# Patient Record
Sex: Female | Born: 1995 | Race: White | Hispanic: No | Marital: Single | Smoking: Never smoker
Health system: Southern US, Community
[De-identification: ages and names within clinical notes are randomized; demographics above are authoritative.]

## PROBLEM LIST (undated history)

## (undated) DIAGNOSIS — R569 Unspecified convulsions: Secondary | ICD-10-CM

---

## 2016-06-19 ENCOUNTER — Encounter (HOSPITAL_COMMUNITY): Payer: Self-pay | Admitting: Family Medicine

## 2016-06-19 ENCOUNTER — Emergency Department (HOSPITAL_COMMUNITY): Payer: BLUE CROSS/BLUE SHIELD

## 2016-06-19 ENCOUNTER — Emergency Department (HOSPITAL_COMMUNITY)
Admission: EM | Admit: 2016-06-19 | Discharge: 2016-06-19 | Disposition: A | Discharge status: Home / Self Care | Payer: BLUE CROSS/BLUE SHIELD | Attending: Emergency Medicine | Admitting: Emergency Medicine

## 2016-06-19 DIAGNOSIS — R0789 Other chest pain: Secondary | ICD-10-CM | POA: Insufficient documentation

## 2016-06-19 HISTORY — DX: Unspecified convulsions: R56.9

## 2016-06-19 LAB — CBC
HCT: 36.3 % (ref 36.0–46.0)
HEMOGLOBIN: 11.7 g/dL — AB (ref 12.0–15.0)
MCH: 25.5 pg — AB (ref 26.0–34.0)
MCHC: 32.2 g/dL (ref 30.0–36.0)
MCV: 79.1 fL (ref 78.0–100.0)
Platelets: 297 10*3/uL (ref 150–400)
RBC: 4.59 MIL/uL (ref 3.87–5.11)
RDW: 16.2 % — ABNORMAL HIGH (ref 11.5–15.5)
WBC: 6.5 10*3/uL (ref 4.0–10.5)

## 2016-06-19 LAB — BASIC METABOLIC PANEL
ANION GAP: 6 (ref 5–15)
BUN: 10 mg/dL (ref 6–20)
CHLORIDE: 106 mmol/L (ref 101–111)
CO2: 24 mmol/L (ref 22–32)
Calcium: 8.7 mg/dL — ABNORMAL LOW (ref 8.9–10.3)
Creatinine, Ser: 0.95 mg/dL (ref 0.44–1.00)
GFR calc non Af Amer: >60 mL/min (ref >60)
GLUCOSE: 101 mg/dL — AB (ref 65–99)
Potassium: 3.7 mmol/L (ref 3.5–5.1)
Sodium: 136 mmol/L (ref 135–145)

## 2016-06-19 LAB — I-STAT TROPONIN, ED: Troponin i, poc: 0 ng/mL (ref 0.00–0.08)

## 2016-06-19 NOTE — ED Notes (Signed)
Pt ambulated with discharge papers in hand with friends to check out. Alert and oriented in no distress upon discharge.

## 2016-06-19 NOTE — ED Provider Notes (Signed)
[facility]-EMERGENCY DEPT Provider Note   CSN: [id number] Arrival date & time: 06/19/16  1503     History   Chief Complaint Chief Complaint  Patient presents with  . Chest Pain    HPI Michelle Dillon is a 20 y.o. female. Complains of right-sided anterior chest pain gradual onset 3 PM today while seated. Pain is at right lower anterior chest, nonradiating. Nothing makes symptoms better or worse. Associated symptoms include mild lightheadedness shortness of breath and nausea. Symptoms have resolved except for mild twinges of pain lasting a split-second. No treatment prior to coming here. No other associated symptoms HPI  Past Medical History:  Diagnosis Date  . Seizures (HCC)   epilepsy. Last seizure several years ago. Anxiety attack There are no active problems to display for this patient.   History reviewed. No pertinent surgical history.  OB History    No data available       Home Medications    Prior to Admission medications   Not on File    Family History History reviewed. No pertinent family history.  Social History Social History  Substance Use Topics  . Smoking status: Never Smoker  . Smokeless tobacco: Never Used  . Alcohol use Yes     Comment: Once every other week.    Occasional alcohol. Uses marijuana last time one month ago. No other drug use  Allergies   Patient has no known allergies.   Review of Systems Review of Systems  Constitutional: Negative.   HENT: Negative.   Respiratory: Negative.   Cardiovascular: Negative.   Gastrointestinal: Negative.   Musculoskeletal: Negative.   Skin: Negative.   Neurological: Negative.   Psychiatric/Behavioral: Negative.   All other systems reviewed and are negative.    Physical Exam Updated Vital Signs BP 129/94 (BP Location: Right Arm)   Pulse 78   Temp 97.7 F (36.5 C) (Oral)   Resp 18   Ht 5\' 7"  (1.702 m)   Wt 114 lb (51.7 kg)   LMP 05/29/2016 (Within Days) Comment: 3 weeks ago  SpO2 98%    BMI 17.85 kg/m   Physical Exam  Constitutional: She appears well-developed and well-nourished.  HENT:  Head: Normocephalic and atraumatic.  Eyes: Conjunctivae are normal. Pupils are equal, round, and reactive to light.  Neck: Neck supple. No tracheal deviation present. No thyromegaly present.  Cardiovascular: Normal rate and regular rhythm.   No murmur heard. Pulmonary/Chest: Effort normal and breath sounds normal.  Abdominal: Soft. Bowel sounds are normal. She exhibits no distension. There is no tenderness.  Musculoskeletal: Normal range of motion. She exhibits no edema or tenderness.  Neurological: She is alert. Coordination normal.  Skin: Skin is warm and dry. No rash noted.  Psychiatric: She has a normal mood and affect.  Nursing note and vitals reviewed.    ED Treatments / Results  Labs (all labs ordered are listed, but only abnormal results are displayed) Labs Reviewed  CBC - Abnormal; Notable for the following:       Result Value   Hemoglobin 11.7 (*)    MCH 25.5 (*)    RDW 16.2 (*)    All other components within normal limits  BASIC METABOLIC PANEL  I-STAT TROPOININ, ED    EKG  EKG Interpretation  Date/Time:  Wednesday June 19 2016 15:30:28 EST Ventricular Rate:  81 PR Interval:    QRS Duration: 96 QT Interval:  372 QTC Calculation: 432 R Axis:   88 Text Interpretation:  Sinus rhythm Normal ECG Confirmed by  Mishti Swanton  MD, Doreatha MartinSAM [phone]) on 06/19/2016 4:09:41 PM Also confirmed by Ethelda ChickJACUBOWITZ  MD, Orlando Thalmann [phone]([id number]), editor Wandalee FerdinandLOGAN, KIMBERLY [phone])  on 06/19/2016 4:18:00 PM     chest x-ray viewed by me Results for orders placed or performed during the hospital encounter of 06/19/16  Basic metabolic panel  Result Value Ref Range   Sodium 136 135 - 145 mmol/L   Potassium 3.7 3.5 - 5.1 mmol/L   Chloride 106 101 - 111 mmol/L   CO2 24 22 - 32 mmol/L   Glucose, Bld 101 (H) 65 - 99 mg/dL   BUN 10 6 - 20 mg/dL   Creatinine, Ser 6.570.95 0.44 - 1.00 mg/dL   Calcium 8.7 (L)  8.9 - 10.3 mg/dL   GFR calc non Af Amer >60 >60 mL/min   GFR calc Af Amer >60 >60 mL/min   Anion gap 6 5 - 15  CBC  Result Value Ref Range   WBC 6.5 4.0 - 10.5 K/uL   RBC 4.59 3.87 - 5.11 MIL/uL   Hemoglobin 11.7 (L) 12.0 - 15.0 g/dL   HCT 84.636.3 96.236.0 - 95.246.0 %   MCV 79.1 78.0 - 100.0 fL   MCH 25.5 (L) 26.0 - 34.0 pg   MCHC 32.2 30.0 - 36.0 g/dL   RDW 84.116.2 (H) 32.411.5 - 40.115.5 %   Platelets 297 150 - 400 K/uL  I-stat troponin, ED  Result Value Ref Range   Troponin i, poc 0.00 0.00 - 0.08 ng/mL   Comment 3           Dg Chest 2 View  Result Date: 06/19/2016 CLINICAL DATA:  Right-sided chest pain EXAM: CHEST  2 VIEW COMPARISON:  None. FINDINGS: Normal heart size. Lungs are hyperaerated and clear. No pneumothorax. No pleural effusion. IMPRESSION: No active cardiopulmonary disease. Electronically Signed   By: Jolaine ClickArthur  Hoss M.D.   On: 06/19/2016 16:04   Radiology Dg Chest 2 View  Result Date: 06/19/2016 CLINICAL DATA:  Right-sided chest pain EXAM: CHEST  2 VIEW COMPARISON:  None. FINDINGS: Normal heart size. Lungs are hyperaerated and clear. No pneumothorax. No pleural effusion. IMPRESSION: No active cardiopulmonary disease. Electronically Signed   By: Jolaine ClickArthur  Hoss M.D.   On: 06/19/2016 16:04    Procedures Procedures (including critical care time)  Medications Ordered in ED Medications - No data to display   Initial Impression / Assessment and Plan / ED Course  I have reviewed the triage vital signs and the nursing notes.  Pertinent labs & imaging results that were available during my care of the patient were reviewed by me and considered in my medical decision making (see chart for details). Clinical Course     4:45 PM patient asymptomatic appears well. Chest pain highly atypical. Cardiac risk factors none Heart score was 0. Symptoms atypical for pulmonary embolism. PERC negative. Symptoms highly atypical for aortic dissection or aneurysm. Similar blood pressures in both arms.  Peripheral pulses symmetric bilaterally plan return as needed or follow-up [facility] Final Clinical Impressions(s) / ED Diagnoses  Diagnosis atypical chest pain Final diagnoses:  None    New Prescriptions New Prescriptions   No medications on file     Doug SouSam Dwight Burdo, MD 06/19/16 1650

## 2016-06-19 NOTE — ED Triage Notes (Signed)
Patient complains of right sided chest pain that started about 45 minutes ago while watching a movie. Pt reports pain is sharp with shortness of breath, dizziness, nausea, and generalized weakness.

## 2016-06-19 NOTE — Discharge Instructions (Signed)
Return if concern for any reason or see the [facility]

## 2017-06-07 ENCOUNTER — Emergency Department (HOSPITAL_COMMUNITY): Payer: BLUE CROSS/BLUE SHIELD

## 2017-06-07 ENCOUNTER — Emergency Department (HOSPITAL_COMMUNITY)
Admission: EM | Admit: 2017-06-07 | Discharge: 2017-06-07 | Disposition: A | Discharge status: Home / Self Care | Payer: BLUE CROSS/BLUE SHIELD | Attending: Emergency Medicine | Admitting: Emergency Medicine

## 2017-06-07 ENCOUNTER — Encounter (HOSPITAL_COMMUNITY): Payer: Self-pay | Admitting: Emergency Medicine

## 2017-06-07 DIAGNOSIS — S71111A Laceration without foreign body, right thigh, initial encounter: Secondary | ICD-10-CM | POA: Insufficient documentation

## 2017-06-07 DIAGNOSIS — S91311A Laceration without foreign body, right foot, initial encounter: Secondary | ICD-10-CM | POA: Diagnosis not present

## 2017-06-07 DIAGNOSIS — W540XXA Bitten by dog, initial encounter: Secondary | ICD-10-CM | POA: Diagnosis not present

## 2017-06-07 DIAGNOSIS — Y999 Unspecified external cause status: Secondary | ICD-10-CM | POA: Diagnosis not present

## 2017-06-07 DIAGNOSIS — Y92009 Unspecified place in unspecified non-institutional (private) residence as the place of occurrence of the external cause: Secondary | ICD-10-CM | POA: Insufficient documentation

## 2017-06-07 DIAGNOSIS — Y93K1 Activity, walking an animal: Secondary | ICD-10-CM | POA: Insufficient documentation

## 2017-06-07 DIAGNOSIS — S81811A Laceration without foreign body, right lower leg, initial encounter: Secondary | ICD-10-CM

## 2017-06-07 MED ORDER — BACITRACIN ZINC 500 UNIT/GM EX OINT
TOPICAL_OINTMENT | Freq: Once | CUTANEOUS | Status: AC
  Administered 2017-06-07: 19:00:00 via TOPICAL

## 2017-06-07 MED ORDER — AMOXICILLIN-POT CLAVULANATE 875-125 MG PO TABS: 1.0000 | ORAL_TABLET | Freq: Two times a day (BID) | ORAL | 0 refills | Status: AC

## 2017-06-07 NOTE — ED Provider Notes (Signed)
[facility]-EMERGENCY DEPT Provider Note   CSN: [id number] Arrival date & time: 06/07/17  1600     History   Chief Complaint Chief Complaint  Patient presents with  . Leg Injury  . Animal Bite    HPI Michelle Dillon is a 21 y.o. female.  The history is provided by the patient and medical records. No language interpreter was used.  Animal Bite  Associated symptoms: no numbness    Michelle Dillon is a 21 y.o. female  with a PMH of seizures who presents to the Emergency Department complaining of dog bite to right thigh and foot just prior to arrival. 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 No medications prior to arrival. Pain with movement of the foot, however able to ambulate. She called client who was able to verify that dog's vaccines were all up-to-date. No numbness, tingling or weakness. No prior injuries to RLE. Tetanus up-to-date.   Past Medical History:  Diagnosis Date  . Seizures (HCC)     There are no active problems to display for this patient.   History reviewed. No pertinent surgical history.  OB History    No data available       Home Medications    Prior to Admission medications   Medication Sig Start Date End Date Taking? Authorizing Provider  amoxicillin-clavulanate (AUGMENTIN) 875-125 MG tablet Take 1 tablet by mouth every 12 (twelve) hours. 06/07/17   Ward, Chase Picket, PA-C    Family History No family history on file.  Social History Social History  Substance Use Topics  . Smoking status: Never Smoker  . Smokeless tobacco: Never Used  . Alcohol use Yes     Comment: Once every other week.      Allergies   Patient has no known allergies.   Review of Systems Review of Systems  Musculoskeletal: Positive for myalgias.  Skin: Positive for wound.  Neurological: Negative  for weakness and numbness.     Physical Exam Updated Vital Signs BP (!) 127/97 (BP Location: Left Arm)   Pulse 86   Temp 98.1 F (36.7 C) (Oral)   Resp 18   LMP 05/31/2017   SpO2 99%   Physical Exam  Constitutional: She appears well-developed and well-nourished. No distress.  HENT:  Head: Normocephalic and atraumatic.  Neck: Neck supple.  Cardiovascular: Normal rate, regular rhythm and normal heart sounds.   No murmur heard. Pulmonary/Chest: Effort normal and breath sounds normal. No respiratory distress. She has no wheezes. She has no rales.  Musculoskeletal:  Tenderness to palpation to dorsum of right foot at laceration site. No 5th metatarsal tenderness. Achilles intact. 2+ DP. Sensation intact. Full ROM and 5/5 strength of RLE.   Neurological: She is alert.  Skin: Skin is warm and dry.  1 cm laceration to the dorsum of the right foot.  2.5 cm laceration to the right inner thigh.   Nursing note and vitals reviewed.    ED Treatments / Results  Labs (all labs ordered are listed, but only abnormal results are displayed) Labs Reviewed - No data to display  EKG  EKG Interpretation None       Radiology Dg Foot Complete Right  Result Date: 06/07/2017 CLINICAL DATA:  Dog bite today. Puncture wound in the right foot. Initial encounter. EXAM: RIGHT FOOT COMPLETE - 3+  VIEW COMPARISON:  None. FINDINGS: There is no evidence of fracture or dislocation. There is no evidence of arthropathy or other focal bone abnormality. Soft tissues are unremarkable. IMPRESSION: Negative. Electronically Signed   By: Marnee SpringJonathon  Watts M.D.   On: 06/07/2017 17:09    Procedures Procedures (including critical care time)  Medications Ordered in ED Medications  bacitracin ointment (not administered)     Initial Impression / Assessment and Plan / ED Course  I have reviewed the triage vital signs and the nursing notes.  Pertinent labs & imaging results that were available during my care of  the patient were reviewed by me and considered in my medical decision making (see chart for details).    Osie CheeksJessica Benedicto is a 21 y.o. female who presents to ED for dog bite to right thigh and foot. X-ray negative. RLE NVI. Full ROM and 5/5 muscle strength. Dog's vaccines all verified and up-to-date. Patient received tetanus shot two years ago. Wounds thoroughly irrigated, cleaned and dressed in ED. Symptomatic home care instructions discussed. Reasons to return to ER and signs of infection discussed. Rx for Augmentin given. All questions answered.    Final Clinical Impressions(s) / ED Diagnoses   Final diagnoses:  Dog bite, initial encounter  Laceration of right lower leg, initial encounter    New Prescriptions New Prescriptions   AMOXICILLIN-CLAVULANATE (AUGMENTIN) 875-125 MG TABLET    Take 1 tablet by mouth every 12 (twelve) hours.     Ward, Chase PicketJaime Pilcher, PA-C 06/07/17 Paulo [id number]    Doug SouJacubowitz, Sam, MD 06/07/17 [phone]

## 2017-06-07 NOTE — ED Triage Notes (Addendum)
Per patient, states she got a call to walk a dog-employed by [employer], owner states dog was territorial but has never bit anyone-states she walked dog with no problem until she got it home and turned her back to get it some food-dog grabbed her right posterior thigh and top of rigtht foot-small puncture wounds to back of thigh and top of right foot-unsure if dog had its shots

## 2017-06-07 NOTE — Discharge Instructions (Signed)
It was my pleasure taking care of you today!   Please take all of your antibiotics until finished!  Keep wounds clean and dry. Wash twice daily with soap and water. Pat dry. Keep covered.   Ibuprofen as needed for pain. Ice to the affected area can aide in pain relief as well.   Return to ER for new or worsening symptoms, any additional concerns.

## 2018-08-27 IMAGING — CR DG FOOT COMPLETE 3+V*R*
3 series · 3 of 3 positions shown · non-contrast
Comparison: None.

CLINICAL DATA: Dog bite today. Puncture wound in the right foot.
Initial encounter.

EXAM:
RIGHT FOOT COMPLETE - 3+ VIEW

[x foot ap right]
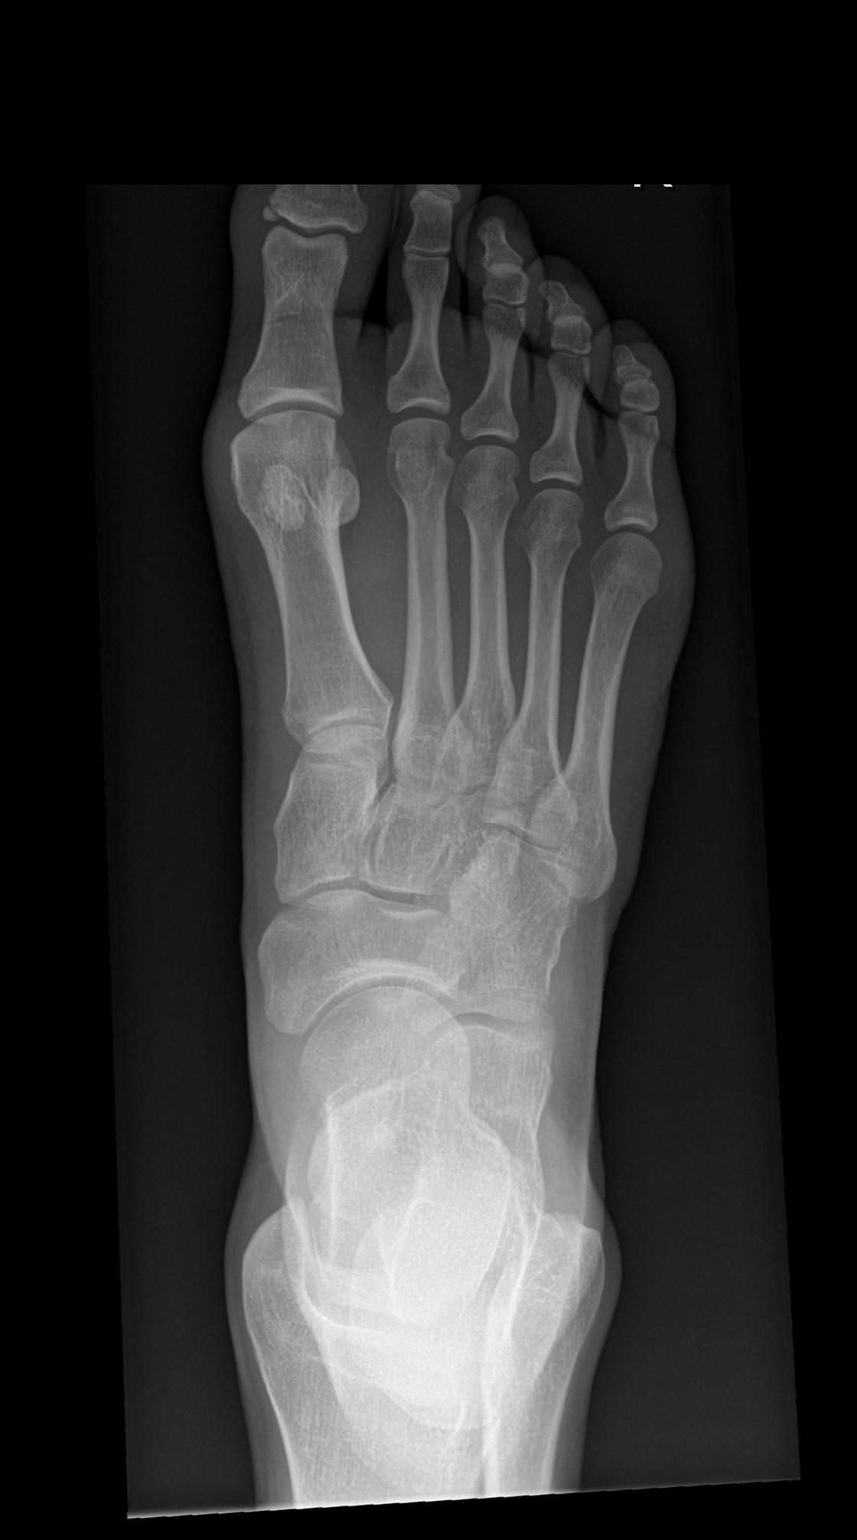

[x foot obl right]
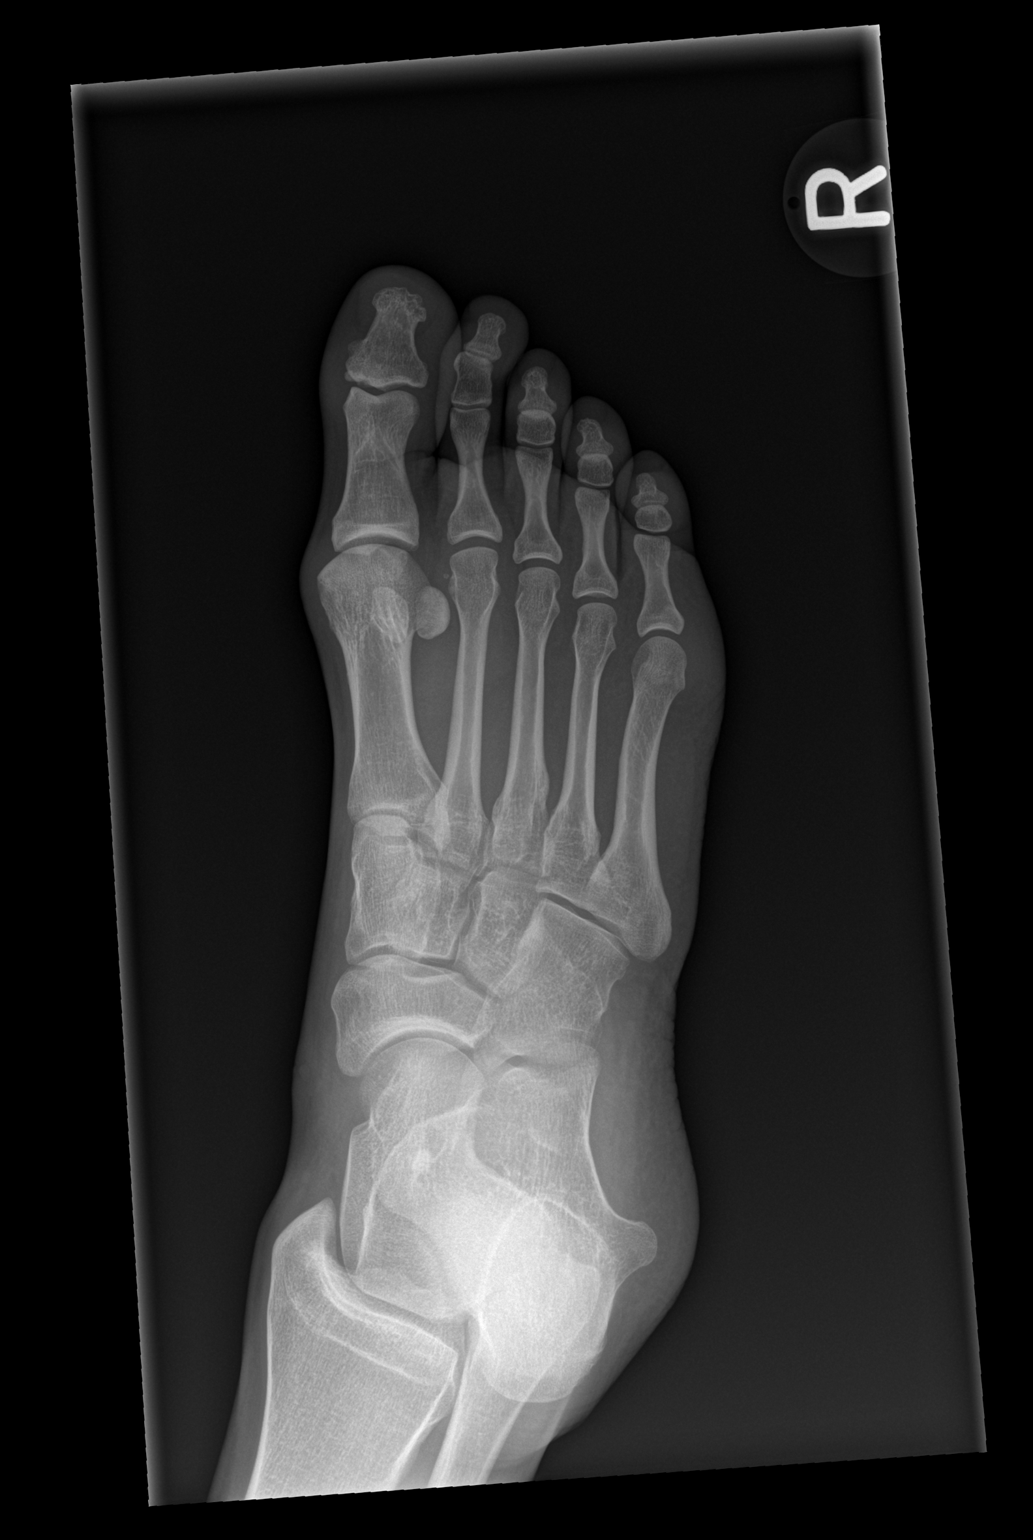

[x foot lat right]
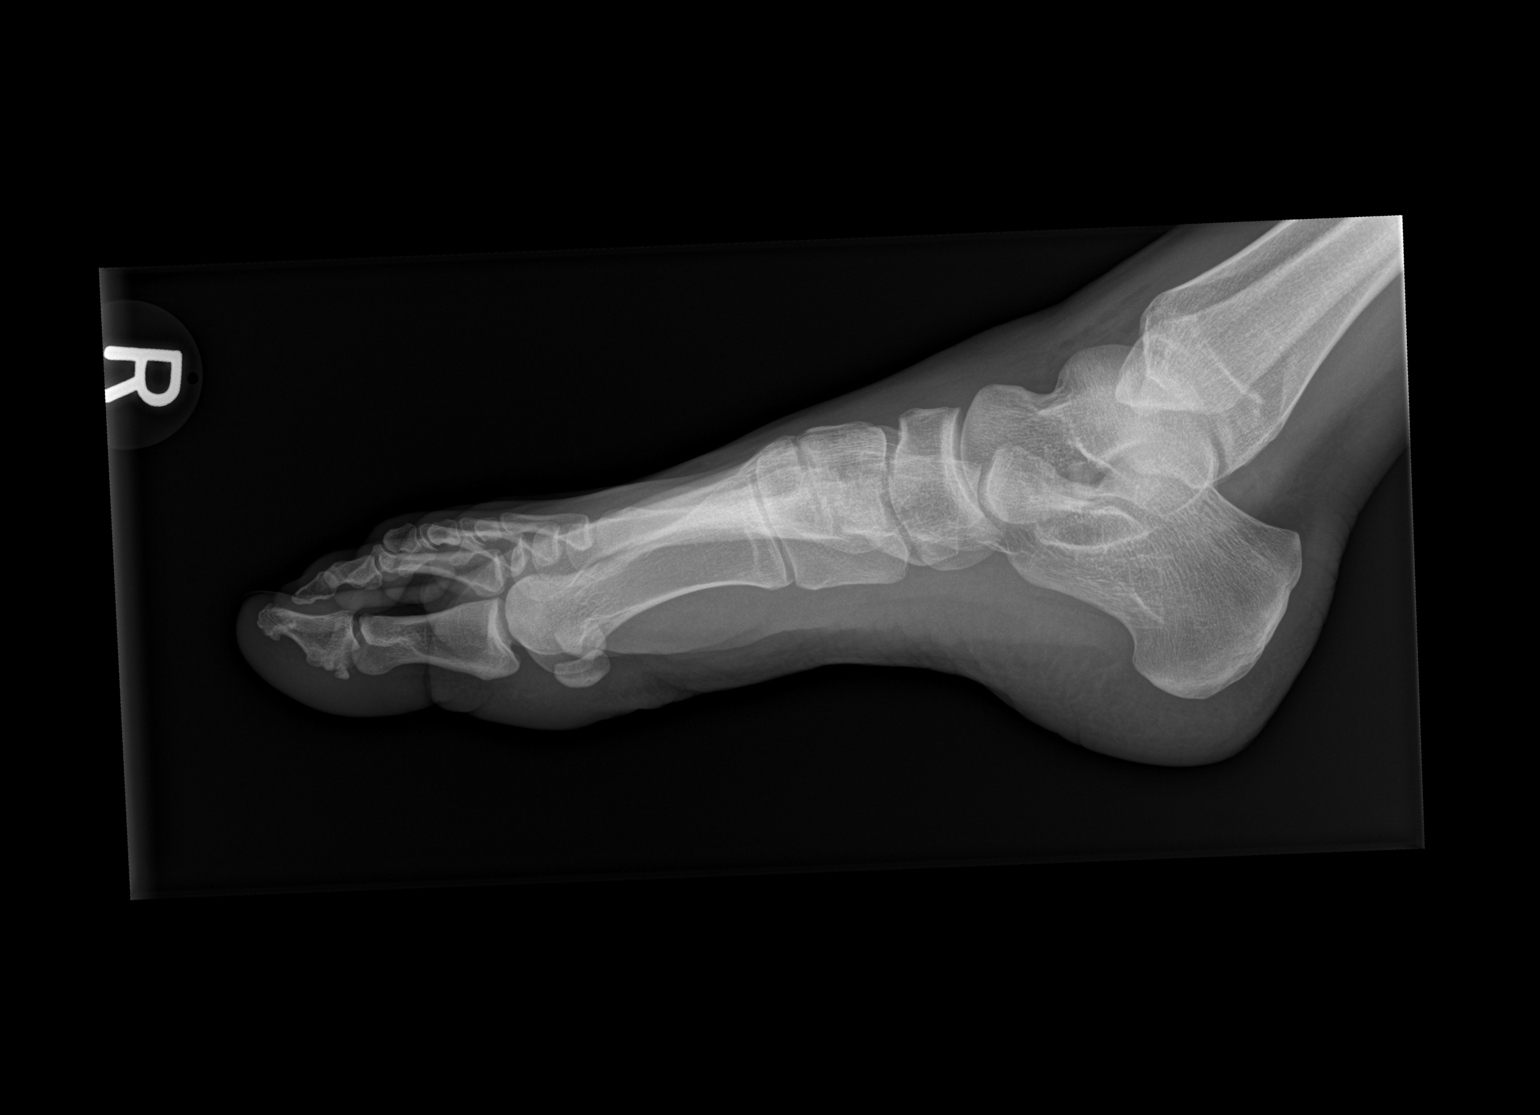

[3 of 3 positions shown; findings below may reference images not displayed]

FINDINGS: There is no evidence of fracture or dislocation. There is no
evidence of arthropathy or other focal bone abnormality. Soft
tissues are unremarkable.
IMPRESSION: Negative.
